# Patient Record
Sex: Male | Born: 1950 | Race: White | Hispanic: No | Marital: Married | Smoking: Never smoker
Health system: Southern US, Community
[De-identification: ages and names within clinical notes are randomized; demographics above are authoritative.]

## PROBLEM LIST (undated history)

## (undated) DIAGNOSIS — G4730 Sleep apnea, unspecified: Secondary | ICD-10-CM

## (undated) HISTORY — PX: TONSILLECTOMY: SUR1361

## (undated) HISTORY — PX: REPLACEMENT TOTAL KNEE: SUR1224

---

## 2018-03-30 ENCOUNTER — Other Ambulatory Visit: Payer: Self-pay

## 2018-03-30 ENCOUNTER — Emergency Department
Admission: EM | Admit: 2018-03-30 | Discharge: 2018-03-30 | Disposition: A | Discharge status: Home / Self Care | Payer: Medicare Other | Attending: Emergency Medicine | Admitting: Emergency Medicine

## 2018-03-30 ENCOUNTER — Encounter: Payer: Self-pay | Admitting: Emergency Medicine

## 2018-03-30 ENCOUNTER — Emergency Department: Payer: Medicare Other

## 2018-03-30 DIAGNOSIS — R002 Palpitations: Secondary | ICD-10-CM | POA: Diagnosis not present

## 2018-03-30 HISTORY — DX: Sleep apnea, unspecified: G47.30

## 2018-03-30 LAB — CBC
HEMATOCRIT: 47.6 % (ref 40.0–52.0)
HEMOGLOBIN: 16.2 g/dL (ref 13.0–18.0)
MCH: 29.6 pg (ref 26.0–34.0)
MCHC: 34 g/dL (ref 32.0–36.0)
MCV: 87.2 fL (ref 80.0–100.0)
Platelets: 258 10*3/uL (ref 150–440)
RBC: 5.46 MIL/uL (ref 4.40–5.90)
RDW: 13.5 % (ref 11.5–14.5)
WBC: 7.8 10*3/uL (ref 3.8–10.6)

## 2018-03-30 LAB — BASIC METABOLIC PANEL
Anion gap: 8 (ref 5–15)
BUN: 28 mg/dL — ABNORMAL HIGH (ref 6–20)
CHLORIDE: 109 mmol/L (ref 101–111)
CO2: 23 mmol/L (ref 22–32)
Calcium: 9 mg/dL (ref 8.9–10.3)
Creatinine, Ser: 1.05 mg/dL (ref 0.61–1.24)
GFR calc non Af Amer: >60 mL/min (ref >60)
Glucose, Bld: 116 mg/dL — ABNORMAL HIGH (ref 65–99)
POTASSIUM: 3.8 mmol/L (ref 3.5–5.1)
Sodium: 140 mmol/L (ref 135–145)

## 2018-03-30 LAB — TROPONIN I: Troponin I: <0.03 ng/mL (ref <0.03)

## 2018-03-30 MED ORDER — MAGNESIUM SULFATE 2 GM/50ML IV SOLN
2.0000 g | Freq: Once | INTRAVENOUS | Status: AC
  Administered 2018-03-30: 2 g via INTRAVENOUS
  Filled 2018-03-30: qty 50

## 2018-03-30 NOTE — Discharge Instructions (Signed)
It was a pleasure to take care of you today, and thank you for coming to our emergency department.  If you have any questions or concerns before leaving please ask the nurse to grab me and I'm more than happy to go through your aftercare instructions again.  If you were prescribed any opioid pain medication today such as Norco, Vicodin, Percocet, morphine, hydrocodone, or oxycodone please make sure you do not drive when you are taking this medication as it can alter your ability to drive safely.  If you have any concerns once you are home that you are not improving or are in fact getting worse before you can make it to your follow-up appointment, please do not hesitate to call 911 and come back for further evaluation.  Merrily Brittle, MD  Results for orders placed or performed during the hospital encounter of 03/30/18  Basic metabolic panel  Result Value Ref Range   Sodium 140 135 - 145 mmol/L   Potassium 3.8 3.5 - 5.1 mmol/L   Chloride 109 101 - 111 mmol/L   CO2 23 22 - 32 mmol/L   Glucose, Bld 116 (H) 65 - 99 mg/dL   BUN 28 (H) 6 - 20 mg/dL   Creatinine, Ser 8.46 0.61 - 1.24 mg/dL   Calcium 9.0 8.9 - 96.2 mg/dL   GFR calc non Af Amer >60 >60 mL/min   GFR calc Af Amer >60 >60 mL/min   Anion gap 8 5 - 15  CBC  Result Value Ref Range   WBC 7.8 3.8 - 10.6 K/uL   RBC 5.46 4.40 - 5.90 MIL/uL   Hemoglobin 16.2 13.0 - 18.0 g/dL   HCT 95.2 84.1 - 32.4 %   MCV 87.2 80.0 - 100.0 fL   MCH 29.6 26.0 - 34.0 pg   MCHC 34.0 32.0 - 36.0 g/dL   RDW 40.1 02.7 - 25.3 %   Platelets 258 150 - 440 K/uL  Troponin I  Result Value Ref Range   Troponin I <0.03 <0.03 ng/mL   Dg Chest 2 View  Result Date: 03/30/2018 CLINICAL DATA:  Shortness of breath and tachycardia. EXAM: CHEST - 2 VIEW COMPARISON:  None. FINDINGS: The heart size and mediastinal contours are within normal limits. Both lungs are clear. The visualized skeletal structures are unremarkable. IMPRESSION: No active cardiopulmonary disease.  Electronically Signed   By: Obie Dredge M.D.   On: 03/30/2018 18:53

## 2018-03-30 NOTE — ED Notes (Signed)
Pt denies pain or abnormal rhythm. Pt instructed to call out if rapid heart rate returns.

## 2018-03-30 NOTE — ED Provider Notes (Signed)
[facility] Emergency Department Provider Note  ____________________________________________   First MD Initiated Contact with Patient 03/30/18 1805     (approximate)  I have reviewed the triage vital signs and the nursing notes.   HISTORY  Chief Complaint Palpitations   HPI Jon Wolf is a 67 y.o. male who presents to the emergency department with a brief episode of palpitations earlier today.  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  He has no history of coronary artery disease.  He has no history of arrhythmia.  He has never seen a cardiologist.  He currently is asymptomatic.  He denies chest pain shortness of breath abdominal pain nausea or vomiting.  His symptoms came on suddenly lasted transiently and went away on their own.  Past Medical History:  Diagnosis Date  . Sleep apnea     There are no active problems to display for this patient.   Past Surgical History:  Procedure Laterality Date  . REPLACEMENT TOTAL KNEE Right   . TONSILLECTOMY      Prior to Admission medications   Not on File    Allergies Penicillins  History reviewed. No pertinent family history.  Social History Social History   Tobacco Use  . Smoking status: Never Smoker  . Smokeless tobacco: Never Used  Substance Use Topics  . Alcohol use: Yes    Alcohol/week: 1.8 oz    Types: 3 Cans of beer per week  . Drug use: Not Currently    Review of Systems Constitutional: No fever/chills Eyes: No visual changes. ENT: No sore throat. Cardiovascular: Denies chest pain. Respiratory: Denies shortness of breath. Gastrointestinal: No abdominal pain.  No nausea, no vomiting.  No diarrhea.  No constipation. Genitourinary: Negative for dysuria. Musculoskeletal: Negative for back pain. Skin: Negative for rash. Neurological: Negative for  headaches, focal weakness or numbness.   ____________________________________________   PHYSICAL EXAM:  VITAL SIGNS: ED Triage Vitals [03/30/18 1805]  Enc Vitals Group     BP      Pulse      Resp      Temp      Temp src      SpO2      Weight 215 lb (97.5 kg)     Height  (1.854 m)     Head Circumference      Peak Flow      Pain Score 0     Pain Loc      Pain Edu?      Excl. in GC?     Constitutional: Alert and oriented x4 well-appearing nontoxic no diaphoresis speaks full clear sentences Eyes: PERRL EOMI. Head: Atraumatic. Nose: No congestion/rhinnorhea. Mouth/Throat: No trismus Neck: No stridor.   Cardiovascular: Normal rate, regular rhythm. Grossly normal heart sounds.  Good peripheral circulation. Respiratory: Normal respiratory effort.  No retractions. Lungs CTAB and moving good air Gastrointestinal: Soft nontender Musculoskeletal: No lower extremity edema   Neurologic:  Normal speech and language. No gross focal neurologic deficits are appreciated. Skin:  Skin is warm, dry and intact. No rash noted. Psychiatric: Mood and affect are normal. Speech and behavior are normal.    ____________________________________________   DIFFERENTIAL includes but not limited to  Thyrotoxicosis, SVT, atrial fibrillation ____________________________________________   LABS (all labs ordered are listed, but only abnormal results are displayed)  Labs Reviewed  BASIC  METABOLIC PANEL - Abnormal; Notable for the following components:      Result Value   Glucose, Bld 116 (*)    BUN 28 (*)    All other components within normal limits  CBC  TROPONIN I    Lab work reviewed by me with no acute disease __________________________________________  EKG   ____________________________________________  RADIOLOGY  Chest x-ray reviewed by me with no acute disease ____________________________________________   PROCEDURES  Procedure(s) performed:  no  Procedures  Critical Care performed: no  Observation: no ____________________________________________   INITIAL IMPRESSION / ASSESSMENT AND PLAN / ED COURSE  Pertinent labs & imaging results that were available during my care of the patient were reviewed by me and considered in my medical decision making (see chart for details).  The patient arrives hemodynamically stable and well-appearing.  EKG is nonischemic.  His symptoms of a heart rate up to 180 that dropped down to 140 could be sinus tachycardia versus atrial fibrillation.  Less likely would be SVT given the variability.  Kept on monitor several hours here with no ectopy.  I had a lengthy discussion with the patient regarding the diagnostic uncertainty however he understands the importance of following up with cardiology and his primary care for consideration of a Holter monitor.  He understands return to the emergency department should his symptoms recur.  Strict return precautions have been given and patient verbalized understanding and agreement with plan.      ____________________________________________   FINAL CLINICAL IMPRESSION(S) / ED DIAGNOSES  Final diagnoses:  Palpitations      NEW MEDICATIONS STARTED DURING THIS VISIT:  There are no discharge medications for this patient.    Note:  This document was prepared using Dragon voice recognition software and may include unintentional dictation errors.     Merrily Brittle, MD 04/03/18 [phone]

## 2019-07-14 IMAGING — CR DG CHEST 2V
2 series · 2 of 2 positions shown · non-contrast
Comparison: None.

CLINICAL DATA: Shortness of breath and tachycardia.

EXAM:
CHEST - 2 VIEW

[chest pa]
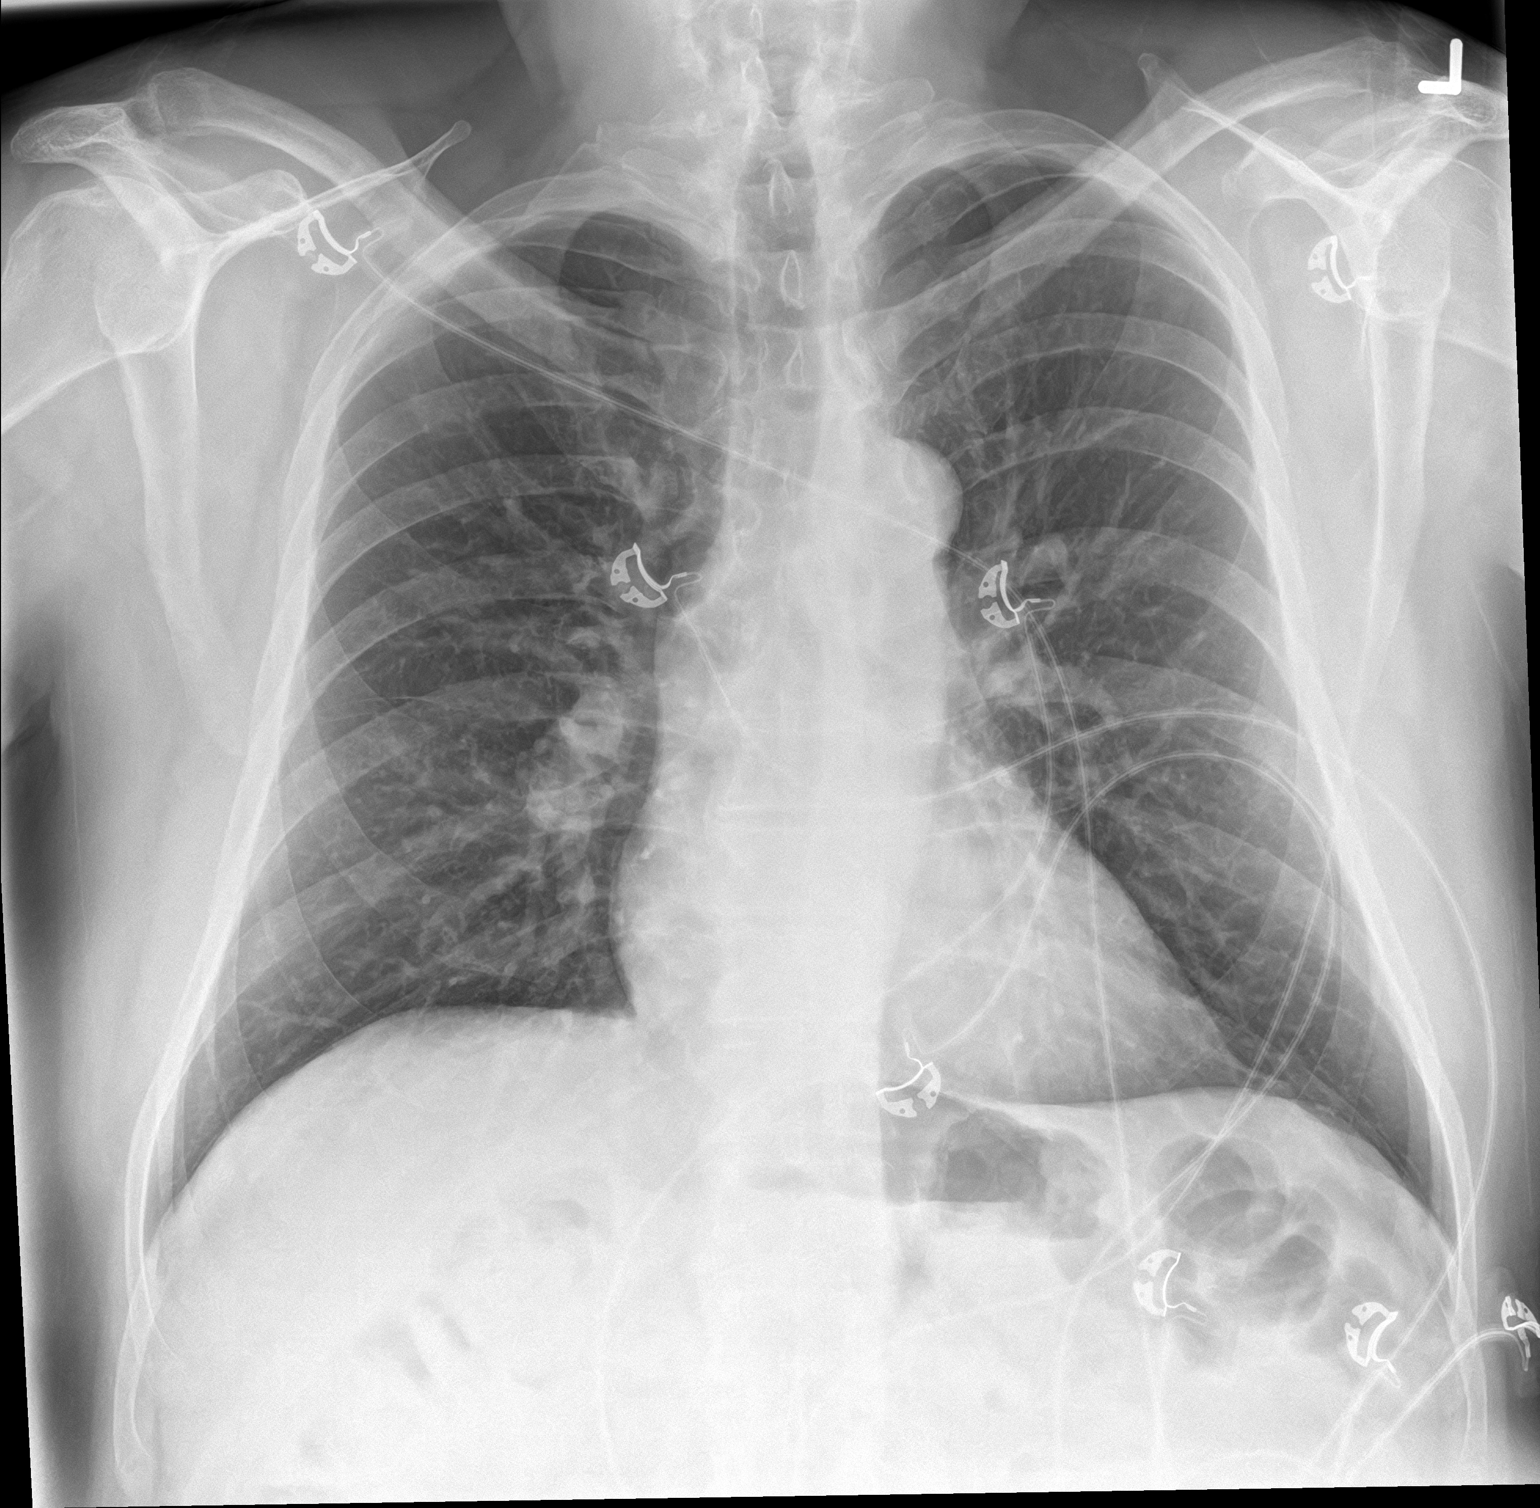

[chest lat]
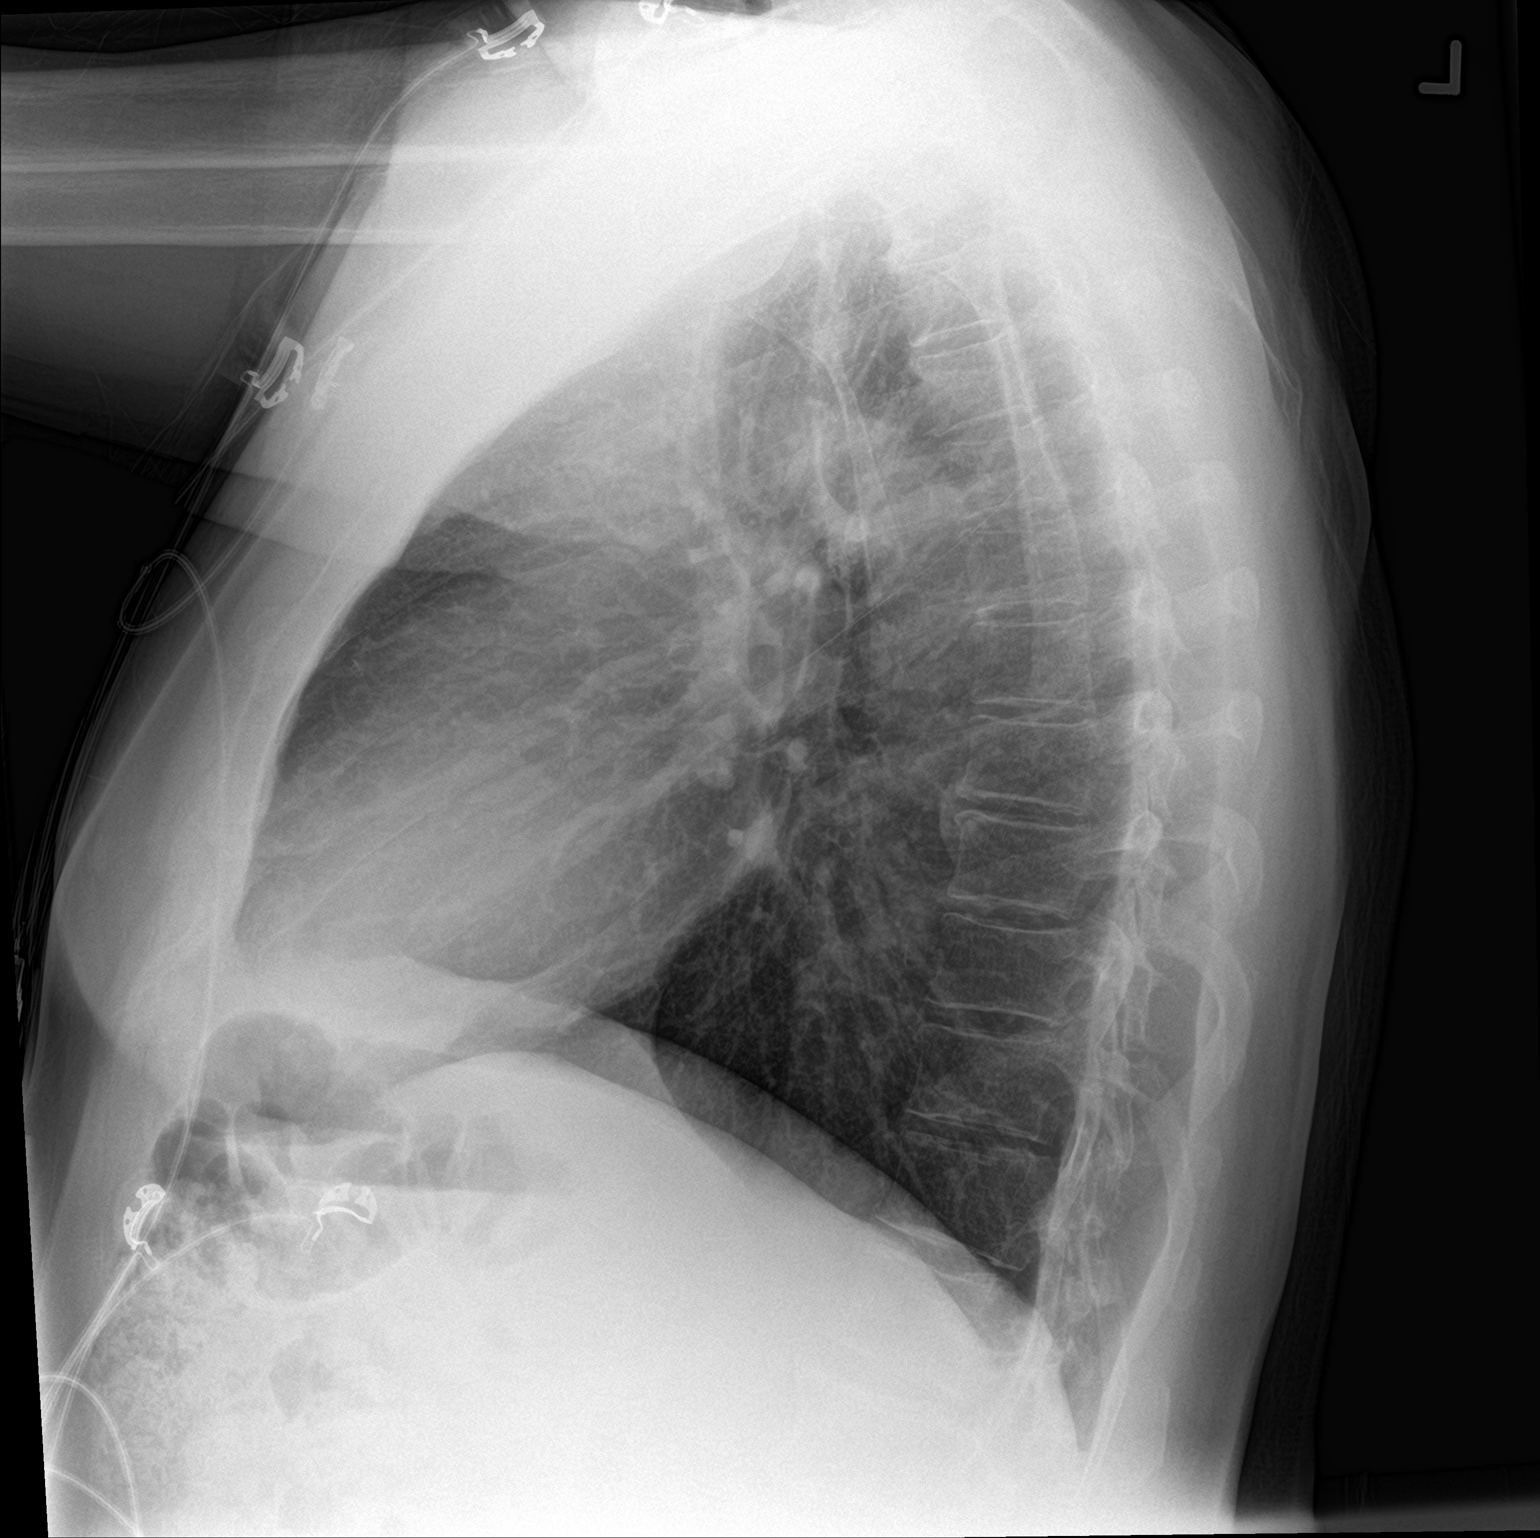

[2 of 2 positions shown; findings below may reference images not displayed]

FINDINGS: The heart size and mediastinal contours are within normal limits.
Both lungs are clear. The visualized skeletal structures are
unremarkable.
IMPRESSION: No active cardiopulmonary disease.
